# Patient Record
Sex: Female | Born: 2005 | Race: Black or African American | Hispanic: No | Marital: Single | Smoking: Never smoker
Health system: Southern US, Community
[De-identification: ages and names within clinical notes are randomized; demographics above are authoritative.]

## PROBLEM LIST (undated history)

## (undated) DIAGNOSIS — J45909 Unspecified asthma, uncomplicated: Secondary | ICD-10-CM

## (undated) DIAGNOSIS — F419 Anxiety disorder, unspecified: Secondary | ICD-10-CM

## (undated) DIAGNOSIS — K529 Noninfective gastroenteritis and colitis, unspecified: Secondary | ICD-10-CM

## (undated) DIAGNOSIS — F333 Major depressive disorder, recurrent, severe with psychotic symptoms: Secondary | ICD-10-CM

## (undated) DIAGNOSIS — T7840XA Allergy, unspecified, initial encounter: Secondary | ICD-10-CM

## (undated) HISTORY — PX: ANKLE SURGERY: SHX546

---
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
## 2024-01-16 NOTE — Progress Notes (Signed)
 Kimberly Hutchinson is a 18 y.o. female on video visit today for acute illness.  HPI: Patient notes she had sore throat, coughing, congestion last week but resolved. Then, two days ago she developed a headache that last all day followed by nausea/vomiting/diarrhea yesterday with continued coughing and congested. No home test. Patient denies change to sense of smell and taste, denies fever/body aches/chills. She endorses having sweats intermittently. Has taken tylenol  cold/flu AM and PM without benefit. GI symptoms have resolved except for occasional sharp pain.      ROS: Review of Systems  Constitutional:  Positive for diaphoresis and malaise/fatigue. Negative for chills and fever.  HENT:  Positive for congestion. Negative for ear pain, sinus pain and sore throat.   Respiratory:  Positive for cough. Negative for shortness of breath and wheezing.   Cardiovascular:  Negative for chest pain.  Gastrointestinal:  Negative for diarrhea, nausea and vomiting.  Musculoskeletal:  Negative for myalgias.  Skin:  Negative for rash.  Neurological:  Negative for headaches.     No Known Allergies  Past Medical History:  Diagnosis Date  . Allergy   . Anemia   . Depression with anxiety    hospitalized for SI 8/18. now sees counselor and psychiatrist  . Irritable bowel syndrome   . Urinary tract infection        There were no vitals taken for this visit.   Physical Exam Constitutional:      General: She is not in acute distress.    Appearance: Normal appearance.  HENT:     Head: Normocephalic and atraumatic.  Pulmonary:     Effort: Pulmonary effort is normal.  Neurological:     General: No focal deficit present.     Mental Status: She is alert and oriented to person, place, and time.  Psychiatric:        Mood and Affect: Mood and affect normal.        Speech: Speech normal.        Thought Content: Thought content normal.     Limited due to nature of visit.  Plan: Likely  uncomplicated viral illness. Does not seem to be second sickening of viral illness last week given GI symptoms and lack of focal illness. Discussed monitoring for complications of illness or prolonged symptoms. Prescribe promethazine DM for congestion and cough. Take as directed.   Follow up: PRN  Kimberly Seip, NP  This video encounter was conducted with the patient's (or proxy's) verbal consent via secure, interactive audio and video telecommunications while away from clinic/office/hospital.  The patient (or proxy) was instructed to have this encounter in a suitably private space and to only have persons present to whom they give permission to participate. In addition, patient identity was confirmed by use of name plus an additional identifier.  This visit was coded based on medical decision making (MDM).

## 2024-01-17 ENCOUNTER — Ambulatory Visit: Admission: RE | Admit: 2024-01-17 | Discharge: 2024-01-17 | Disposition: A | Discharge status: Home / Self Care

## 2024-01-17 VITALS — BP 120/88 | HR 103 | Temp 98.4°F | Resp 20

## 2024-01-17 DIAGNOSIS — J069 Acute upper respiratory infection, unspecified: Secondary | ICD-10-CM

## 2024-01-17 DIAGNOSIS — J0100 Acute maxillary sinusitis, unspecified: Secondary | ICD-10-CM

## 2024-01-17 LAB — POC SOFIA SARS ANTIGEN FIA: SARS Coronavirus 2 Ag: NEGATIVE

## 2024-01-17 MED ORDER — ALBUTEROL SULFATE HFA 108 (90 BASE) MCG/ACT IN AERS: 1.0000 | INHALATION_SPRAY | Freq: Four times a day (QID) | RESPIRATORY_TRACT | 0 refills | Status: AC | PRN

## 2024-01-17 MED ORDER — AMOXICILLIN-POT CLAVULANATE 875-125 MG PO TABS: 1.0000 | ORAL_TABLET | Freq: Two times a day (BID) | ORAL | 0 refills | Status: AC

## 2024-01-17 NOTE — ED Triage Notes (Signed)
 Patient to Urgent Care with complaints of chest congestion and tightness/ cough/ nasal congestion. Possible fevers.   Symptoms for over a week. Migraine on Sunday. Monday started experiencing NVD. Developed a productive cough 2-3 days.   Completed a virtual visit with [facility] yesterday and was prescribed promethazine-DM. Finding no relief.

## 2024-01-17 NOTE — ED Provider Notes (Signed)
 CAY RALPH PELT    CSN: [id number] Arrival date & time: 01/17/24  0931      History   Chief Complaint Chief Complaint  Patient presents with   Wheezing    Bad cold with GI symptoms - Entered by patient    HPI Kimberly Hutchinson is a 18 y.o. female.  Accompanied by her mother, patient presents with 1 week history of runny nose, congestion, sinus pressure, cough.  She had a headache a couple of days ago but this is resolved.  She had nausea vomiting and diarrhea couple of days ago but these resolved.  No fever or shortness of breath.  Patient had a telehealth visit with [facility] clinic yesterday; diagnosed with cough; treated with Promethazine DM.  Patient denies history of asthma.  She states she has not had to use an inhaler in several years.  The history is provided by the patient and medical records.    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      Home Medications    Prior to Admission medications   Medication Sig Start Date End Date Taking? Authorizing Provider  albuterol  (VENTOLIN  HFA) 108 (90 Base) MCG/ACT inhaler Inhale 1-2 puffs into the lungs every 6 (six) hours as needed. 01/17/24  Yes Corlis Burnard DEL, NP  amoxicillin -clavulanate (AUGMENTIN ) 875-125 MG tablet Take 1 tablet by mouth every 12 (twelve) hours. 01/17/24  Yes Corlis Burnard DEL, NP  buPROPion  (WELLBUTRIN  XL) 300 MG 24 hr tablet Take 1 tablet (300 mg  total) by mouth daily. 09/15/23  Yes Rhys Boyer T, PA-C  cetirizine (ZYRTEC) 10 MG tablet Take 10 mg by mouth daily.   Yes [provider]  FLUoxetine  (PROZAC ) 40 MG capsule TAKE 1 CAPSULE (40 MG TOTAL) BY MOUTH DAILY. 10/27/23  Yes Rhys Boyer T, PA-C  omeprazole (PRILOSEC) 20 MG capsule Take 40 mg by mouth daily. 08/01/21  Yes [provider]  promethazine-dextromethorphan (PROMETHAZINE-DM) 6.25-15 MG/5ML syrup Take 5 mLs by mouth. 01/16/24 01/23/24 Yes [provider]  docusate sodium  (COLACE) 100 MG capsule Take 100 mg by mouth at bedtime.    [provider]  doxycycline  (VIBRA -TABS) 100 MG tablet Take 1 tablet (100 mg total) by mouth 2 (two) times daily. Patient not taking: Reported on 01/17/2024 10/23/23   Menshew, Candida LULLA Kings, PA-C  ELDERBERRY PO Take by mouth. Patient not taking: Reported on 09/15/2023    [provider]  fluticasone  (FLONASE ) 50 MCG/ACT nasal spray Place 1 spray into both nostrils at bedtime. Patient not taking: Reported on 09/22/2021    [provider]  Melatonin 5 MG CHEW Chew by mouth.    [provider]  metFORMIN  (GLUCOPHAGE ) 1000 MG tablet Take 1 tablet (1,000 mg total) by mouth 2 (two) times daily with a meal. Patient not taking: Reported on 10/20/2022 01/21/19   Jonnalagadda, Janardhana, MD  naltrexone  (DEPADE) 50 MG tablet Take 0.5 tablets (  25 mg total) by mouth daily. Patient not taking: Reported on 12/24/2021 09/22/21   Rhys Boyer T, PA-C  phentermine (ADIPEX-P) 37.5 MG tablet Take by mouth. Patient not taking: Reported on 07/30/2023 11/12/21 12/12/21  [provider]  WEGOVY 0.25 MG/0.5ML SOAJ SMARTSIG:0.5 Milliliter(s) SUB-Q Once a Week Patient not taking: Reported on 09/15/2023    [provider]  WEGOVY 2.4 MG/0.75ML SOAJ INJECT 0.75 MLS (2.4 MG TOTAL) SUBCUTANEOUSLY ONCE A WEEK FOR 90 DAYS    [provider]  ZEPBOUND 7.5 MG/0.5ML Pen SMARTSIG:0.5 Milliliter(s) SUB-Q Once a  Week Patient not taking: Reported on 09/15/2023    [provider]  zinc gluconate 50 MG tablet Take 50 mg by mouth daily. Patient not taking: Reported on 06/12/2023    [provider]    Family History Family History  Problem Relation Age of Onset   Depression Father    Anxiety disorder Father    Bipolar disorder Maternal Aunt    Schizophrenia Paternal Aunt     Social History Social History   Tobacco Use   Smoking status: Never   Smokeless tobacco: Never  Vaping Use   Vaping status: Never Used  Substance Use Topics   Alcohol use: Never   Drug use: Never     Allergies   Patient has no known allergies.   Review of Systems Review of Systems  Constitutional:  Negative for chills and fever.  HENT:  Positive for congestion, postnasal drip, rhinorrhea and sinus pressure. Negative for ear pain and sore throat.   Respiratory:  Positive for cough. Negative for shortness of breath.   Cardiovascular:  Negative for chest pain and palpitations.     Physical Exam Triage Vital Signs ED Triage Vitals  Encounter Vitals Group     BP 01/17/24 0953 120/88     Girls Systolic BP Percentile --      Girls Diastolic BP Percentile --      Boys Systolic BP Percentile --      Boys Diastolic BP Percentile --      Pulse Rate 01/17/24 0953 (!) 103     Resp 01/17/24 0953 20     Temp 01/17/24 0953 98.4 F (36.9 C)     Temp src --      SpO2 01/17/24 0953 97 %     Weight --      Height --      Head Circumference --      Peak Flow --      Pain Score 01/17/24 0952 4     Pain Loc --      Pain Education --      Exclude from Growth Chart --    No data found.  Updated Vital Signs BP 120/88   Pulse (!) 103   Temp 98.4 F (36.9 C)   Resp 20   LMP 01/13/2024   SpO2 97%   Visual Acuity Right Eye Distance:   Left Eye Distance:   Bilateral Distance:    Right Eye Near:   Left Eye Near:    Bilateral Near:     Physical Exam Constitutional:      General: She is not  in acute distress.    Appearance: She is obese.  HENT:     Right Ear: Tympanic membrane normal.     Left Ear: Tympanic membrane normal.     Nose: Congestion and rhinorrhea present.     Mouth/Throat:     Mouth: Mucous membranes are moist.  Pharynx: Oropharynx is clear.  Cardiovascular:     Rate and Rhythm: Normal rate and regular rhythm.     Heart sounds: Normal heart sounds.  Pulmonary:     Effort: Pulmonary effort is normal. No respiratory distress.     Breath sounds: Normal breath sounds. No wheezing.  Neurological:     Mental Status: She is alert.      UC Treatments / Results  Labs (all labs ordered are listed, but only abnormal results are displayed) Labs Reviewed  POC SOFIA SARS ANTIGEN FIA    EKG   Radiology No results found.  Procedures Procedures (including critical care time)  Medications Ordered in UC Medications - No data to display  Initial Impression / Assessment and Plan / UC Course  I have reviewed the triage vital signs and the nursing notes.  Pertinent labs & imaging results that were available during my care of the patient were reviewed by me and considered in my medical decision making (see chart for details).    Acute sinusitis, acute upper respiratory infection.  Lungs are clear and O2 sat is 97% on room air.  Rapid COVID test is negative.  Treating today with albuterol  inhaler and Augmentin .  Education provided on sinusitis and URI.  Instructed patient to follow-up with her PCP if she is not improving.  She agrees to plan of care.  Final Clinical Impressions(s) / UC Diagnoses   Final diagnoses:  Acute non-recurrent maxillary sinusitis  Acute upper respiratory infection     Discharge Instructions      Take the Augmentin  and use the albuterol  inhaler as directed.  Follow-up with your primary care provider if your symptoms are not improving.      ED Prescriptions     Medication Sig Dispense Auth. Provider   albuterol  (VENTOLIN   HFA) 108 (90 Base) MCG/ACT inhaler Inhale 1-2 puffs into the lungs every 6 (six) hours as needed. 18 g Corlis Burnard DEL, NP   amoxicillin -clavulanate (AUGMENTIN ) 875-125 MG tablet Take 1 tablet by mouth every 12 (twelve) hours. 14 tablet Corlis Burnard DEL, NP      PDMP not reviewed this encounter.   Corlis Burnard DEL, NP 01/17/24 1026

## 2024-01-17 NOTE — Discharge Instructions (Addendum)
 Take the Augmentin  and use the albuterol  inhaler as directed.  Follow-up with your primary care provider if your symptoms are not improving.

## 2024-03-22 ENCOUNTER — Ambulatory Visit (INDEPENDENT_AMBULATORY_CARE_PROVIDER_SITE_OTHER): Payer: Self-pay | Admitting: Physician Assistant

## 2024-03-22 DIAGNOSIS — Z91199 Patient's noncompliance with other medical treatment and regimen due to unspecified reason: Secondary | ICD-10-CM

## 2024-03-22 NOTE — Progress Notes (Signed)
 No show
